# Patient Record
Sex: Male | Born: 1955 | Race: White | Hispanic: No | Marital: Married | Smoking: Never smoker
Health system: Southern US, Community
[De-identification: ages and names within clinical notes are randomized; demographics above are authoritative.]

## PROBLEM LIST (undated history)

## (undated) DIAGNOSIS — I10 Essential (primary) hypertension: Secondary | ICD-10-CM

## (undated) DIAGNOSIS — E785 Hyperlipidemia, unspecified: Secondary | ICD-10-CM

## (undated) DIAGNOSIS — I4892 Unspecified atrial flutter: Secondary | ICD-10-CM

## (undated) DIAGNOSIS — G4730 Sleep apnea, unspecified: Secondary | ICD-10-CM

## (undated) HISTORY — PX: CHOLECYSTECTOMY: SHX55

## (undated) HISTORY — PX: CARDIAC ELECTROPHYSIOLOGY MAPPING AND ABLATION: SHX1292

## (undated) SURGERY — ELECTROPHYSIOLOGY STUDY
Anesthesia: IV Sedation (MBSC Only)

---

## 2005-05-25 ENCOUNTER — Emergency Department: Payer: Self-pay | Admitting: Unknown Physician Specialty

## 2007-05-17 ENCOUNTER — Ambulatory Visit: Payer: Self-pay | Admitting: Gastroenterology

## 2011-11-23 ENCOUNTER — Ambulatory Visit: Payer: Self-pay

## 2021-08-26 ENCOUNTER — Other Ambulatory Visit: Payer: Self-pay | Admitting: Neurology

## 2021-08-26 DIAGNOSIS — R413 Other amnesia: Secondary | ICD-10-CM

## 2021-09-01 ENCOUNTER — Other Ambulatory Visit: Payer: Self-pay

## 2021-09-01 ENCOUNTER — Ambulatory Visit
Admission: RE | Admit: 2021-09-01 | Discharge: 2021-09-01 | Disposition: A | Discharge status: Home / Self Care | Payer: Medicare Other | Source: Ambulatory Visit | Attending: Neurology | Admitting: Neurology

## 2021-09-01 DIAGNOSIS — R413 Other amnesia: Secondary | ICD-10-CM | POA: Diagnosis present

## 2021-11-25 ENCOUNTER — Encounter: Payer: Self-pay | Admitting: Emergency Medicine

## 2021-11-25 ENCOUNTER — Emergency Department
Admission: EM | Admit: 2021-11-25 | Discharge: 2021-11-25 | Disposition: A | Discharge status: Home / Self Care | Payer: Medicare Other | Attending: Emergency Medicine | Admitting: Emergency Medicine

## 2021-11-25 ENCOUNTER — Other Ambulatory Visit: Payer: Self-pay

## 2021-11-25 ENCOUNTER — Emergency Department: Payer: Medicare Other

## 2021-11-25 DIAGNOSIS — I483 Typical atrial flutter: Secondary | ICD-10-CM | POA: Diagnosis present

## 2021-11-25 DIAGNOSIS — Z79899 Other long term (current) drug therapy: Secondary | ICD-10-CM | POA: Insufficient documentation

## 2021-11-25 DIAGNOSIS — Z7901 Long term (current) use of anticoagulants: Secondary | ICD-10-CM | POA: Diagnosis not present

## 2021-11-25 DIAGNOSIS — I10 Essential (primary) hypertension: Secondary | ICD-10-CM | POA: Diagnosis not present

## 2021-11-25 HISTORY — DX: Essential (primary) hypertension: I10

## 2021-11-25 HISTORY — DX: Hyperlipidemia, unspecified: E78.5

## 2021-11-25 HISTORY — DX: Unspecified atrial flutter: I48.92

## 2021-11-25 LAB — BASIC METABOLIC PANEL
Anion gap: 8 (ref 5–15)
BUN: 19 mg/dL (ref 8–23)
CO2: 26 mmol/L (ref 22–32)
Calcium: 9.3 mg/dL (ref 8.9–10.3)
Chloride: 101 mmol/L (ref 98–111)
Creatinine, Ser: 1.03 mg/dL (ref 0.61–1.24)
GFR, Estimated: >60 mL/min (ref >60)
Glucose, Bld: 111 mg/dL — ABNORMAL HIGH (ref 70–99)
Potassium: 4.1 mmol/L (ref 3.5–5.1)
Sodium: 135 mmol/L (ref 135–145)

## 2021-11-25 LAB — TROPONIN I (HIGH SENSITIVITY): Troponin I (High Sensitivity): 7 ng/L (ref <18)

## 2021-11-25 LAB — CBC
HCT: 47.5 % (ref 39.0–52.0)
Hemoglobin: 17.5 g/dL — ABNORMAL HIGH (ref 13.0–17.0)
MCH: 32.7 pg (ref 26.0–34.0)
MCHC: 36.8 g/dL — ABNORMAL HIGH (ref 30.0–36.0)
MCV: 88.8 fL (ref 80.0–100.0)
Platelets: 293 10*3/uL (ref 150–400)
RBC: 5.35 MIL/uL (ref 4.22–5.81)
RDW: 12.2 % (ref 11.5–15.5)
WBC: 7.4 10*3/uL (ref 4.0–10.5)
nRBC: 0 % (ref 0.0–0.2)

## 2021-11-25 MED ORDER — DABIGATRAN ETEXILATE MESYLATE 150 MG PO CAPS: 150.0000 mg | ORAL_CAPSULE | Freq: Two times a day (BID) | ORAL | 0 refills | Status: AC

## 2021-11-25 MED ORDER — METOPROLOL TARTRATE 25 MG PO TABS: 25.0000 mg | ORAL_TABLET | Freq: Two times a day (BID) | ORAL | 11 refills | Status: AC

## 2021-11-25 NOTE — ED Triage Notes (Signed)
Pt comes into the ED via Neurology where they realized the patient has a heart rate of 150.  Pt denies any chest pain, SHOB, or dizziness.  Pt ambulatory to triage at this time.  Pt has a h/o ablations at [facility] and he is currently supposed to be on blood thinners (Eliquis), but he stopped taking it because it made him feel poorly.  Pt's cardiologist is aware and informed him that he needed to at least be on a baby ASA.  Pt is currently taking the ASA.

## 2021-11-25 NOTE — ED Notes (Signed)
Bradler, MD at bedside. 

## 2021-11-25 NOTE — ED Provider Notes (Signed)
[facility] Emergency Department Provider Note   ____________________________________________   Event Date/Time   First MD Initiated Contact with Patient 11/25/21 1116     (approximate)  I have reviewed the triage vital signs and the nursing notes.   HISTORY  Chief Complaint Tachycardia    HPI William Mcintosh is a 65 y.o. male who presents from his neurologist's office after being found to incidentally be in atrial flutter.  Patient states that he feels that he may have gone back into a flutter starting 3 days prior to arrival  LOCATION: Chest DURATION: 3 days prior to arrival TIMING: Stable since onset SEVERITY: Mild QUALITY: Palpitations CONTEXT: Patient states that approximately 3 days prior to arrival he noticed some mild palpitations and increasing dyspnea on exertion but this atrial flutter was found incidentally during a neurology visit today MODIFYING FACTORS: Worsened with exertion and partially relieved at rest ASSOCIATED SYMPTOMS: Denies   Per medical record review, patient has history of atrial flutter status post ablation          Past Medical History:  Diagnosis Date   Atrial flutter (HCC)    Hyperlipidemia    Hypertension     There are no problems to display for this patient.   Past Surgical History:  Procedure Laterality Date   CARDIAC ELECTROPHYSIOLOGY MAPPING AND ABLATION     CHOLECYSTECTOMY      Prior to Admission medications   Medication Sig Start Date End Date Taking? Authorizing Provider  dabigatran (PRADAXA) 150 MG CAPS capsule Take 1 capsule (150 mg total) by mouth 2 (two) times daily. 11/25/21 12/25/21 Yes Merwyn Katos, MD  metoprolol tartrate (LOPRESSOR) 25 MG tablet Take 1 tablet (25 mg total) by mouth 2 (two) times daily. 11/25/21 11/25/22 Yes Merwyn Katos, MD    Allergies Patient has no known allergies.  History reviewed. No pertinent family history.  Social History Social History   Tobacco Use    Smoking status: Never   Smokeless tobacco: Never  Substance Use Topics   Alcohol use: Yes    Comment: occasional   Drug use: Not Currently    Review of Systems Constitutional: No fever/chills Eyes: No visual changes. ENT: No sore throat. Cardiovascular: Denies chest pain.  Endorses palpitations and dyspnea on exertion Respiratory: Denies shortness of breath. Gastrointestinal: No abdominal pain.  No nausea, no vomiting.  No diarrhea. Genitourinary: Negative for dysuria. Musculoskeletal: Negative for acute arthralgias Skin: Negative for rash. Neurological: Negative for headaches, weakness/numbness/paresthesias in any extremity Psychiatric: Negative for suicidal ideation/homicidal ideation   ____________________________________________   PHYSICAL EXAM:  VITAL SIGNS: ED Triage Vitals  Enc Vitals Group     BP 11/25/21 1056 (!) 120/93     Pulse Rate 11/25/21 1056 (!) 146     Resp 11/25/21 1056 19     Temp 11/25/21 1056 98.1 F (36.7 C)     Temp Source 11/25/21 1056 Oral     SpO2 11/25/21 1056 96 %     Weight 11/25/21 1054 211 lb (95.7 kg)     Height 11/25/21 1054 5\' 9"  (1.753 m)     Head Circumference --      Peak Flow --      Pain Score 11/25/21 1054 0     Pain Loc --      Pain Edu? --      Excl. in GC? --    Constitutional: Alert and oriented. Well appearing and in no acute distress. Eyes: Conjunctivae are normal. PERRL.  Head: Atraumatic. Nose: No congestion/rhinnorhea. Mouth/Throat: Mucous membranes are moist. Neck: No stridor Cardiovascular: Grossly normal heart sounds.  Good peripheral circulation. Respiratory: Normal respiratory effort.  No retractions. Gastrointestinal: Soft and nontender. No distention. Musculoskeletal: No obvious deformities Neurologic:  Normal speech and language. No gross focal neurologic deficits are appreciated. Skin:  Skin is warm and dry. No rash noted. Psychiatric: Mood and affect are normal. Speech and behavior are  normal.  ____________________________________________   LABS (all labs ordered are listed, but only abnormal results are displayed)  Labs Reviewed  BASIC METABOLIC PANEL - Abnormal; Notable for the following components:      Result Value   Glucose, Bld 111 (*)    All other components within normal limits  CBC - Abnormal; Notable for the following components:   Hemoglobin 17.5 (*)    MCHC 36.8 (*)    All other components within normal limits  TROPONIN I (HIGH SENSITIVITY)  TROPONIN I (HIGH SENSITIVITY)   ____________________________________________  EKG  ED ECG REPORT I, Merwyn Katos, the attending physician, personally viewed and interpreted this ECG.  Date: 11/25/2021 EKG Time: 1054 Rate: 145 Rhythm: Atrial flutter with 2-1 conduction QRS Axis: normal Intervals: normal ST/T Wave abnormalities: normal Narrative Interpretation: Atrial flutter with 2-1 conduction.  No evidence of acute ischemia  ____________________________________________  RADIOLOGY  ED MD interpretation: 2 view chest x-ray shows no evidence of acute abnormalities including no pneumonia, pneumothorax, or widened mediastinum  Official radiology report(s): DG Chest 2 View  Result Date: 11/25/2021 CLINICAL DATA:  Tachycardia. EXAM: CHEST - 2 VIEW COMPARISON:  None. FINDINGS: The heart size and mediastinal contours are within normal limits. Both lungs are clear. The visualized skeletal structures are unremarkable. IMPRESSION: No active cardiopulmonary disease. Electronically Signed   By: Obie Dredge M.D.   On: 11/25/2021 11:37    ____________________________________________   PROCEDURES  Procedure(s) performed (including Critical Care):  Procedures   ____________________________________________   INITIAL IMPRESSION / ASSESSMENT AND PLAN / ED COURSE  As part of my medical decision making, I reviewed the following data within the electronic medical record, if available:  Nursing notes  reviewed and incorporated, Labs reviewed, EKG interpreted, Old chart reviewed, Radiograph reviewed and Notes from prior ED visits reviewed and incorporated        + atrial flutter w/ RVR DDx: Pneumothorax, Pneumonia, Pulmonary Embolus, Tamponade, ACS, Thyrotoxicosis.  No history or evidence decompensated heart failure. Given their history and exam it is likely this patient is unlikely to spontaneously revert to a rate controlled rhythm and necessitates a thorough workup for their arrhythmia. Workup: ECG, CXR, CBC, BMP, UA, Troponin, BNP, TSH, Ca-Mag-Phos Consult: I spoke to Dr. Mariah Milling in cardiology who recommended starting patient on 25 mg metoprolol tartrate with instructions to hold if heart rate is under 100 and increase to 50 twice daily if heart rate stays above 100.  Patient also started back on anticoagulation Disposition: Discharge home with prompt PCP follow up and cardiology referral.         ____________________________________________   FINAL CLINICAL IMPRESSION(S) / ED DIAGNOSES  Final diagnoses:  Typical atrial flutter [facility])     ED Discharge Orders          Ordered    metoprolol tartrate (LOPRESSOR) 25 MG tablet  2 times daily        11/25/21 1235    dabigatran (PRADAXA) 150 MG CAPS capsule  2 times daily        11/25/21 1235  Note:  This document was prepared using Dragon voice recognition software and may include unintentional dictation errors.    Merwyn Katos, MD 11/25/21 [phone]

## 2021-11-29 ENCOUNTER — Encounter: Payer: Self-pay | Admitting: Anesthesiology

## 2021-11-30 ENCOUNTER — Ambulatory Visit: Admission: RE | Admit: 2021-11-30 | Payer: Medicare Other | Source: Ambulatory Visit | Admitting: Cardiology

## 2021-11-30 ENCOUNTER — Encounter: Admission: RE | Payer: Self-pay | Source: Ambulatory Visit

## 2021-11-30 SURGERY — ECHOCARDIOGRAM, TRANSESOPHAGEAL
Anesthesia: General

## 2021-12-11 ENCOUNTER — Encounter: Payer: Self-pay | Admitting: Emergency Medicine

## 2021-12-11 ENCOUNTER — Observation Stay
Admission: EM | Admit: 2021-12-11 | Discharge: 2021-12-12 | Disposition: A | Discharge status: Home / Self Care | Payer: Medicare Other | Attending: Internal Medicine | Admitting: Internal Medicine

## 2021-12-11 ENCOUNTER — Emergency Department: Payer: Medicare Other

## 2021-12-11 ENCOUNTER — Other Ambulatory Visit: Payer: Self-pay

## 2021-12-11 DIAGNOSIS — Z79899 Other long term (current) drug therapy: Secondary | ICD-10-CM | POA: Diagnosis not present

## 2021-12-11 DIAGNOSIS — I482 Chronic atrial fibrillation, unspecified: Secondary | ICD-10-CM

## 2021-12-11 DIAGNOSIS — Z85828 Personal history of other malignant neoplasm of skin: Secondary | ICD-10-CM | POA: Diagnosis not present

## 2021-12-11 DIAGNOSIS — I4892 Unspecified atrial flutter: Secondary | ICD-10-CM | POA: Diagnosis not present

## 2021-12-11 DIAGNOSIS — I4891 Unspecified atrial fibrillation: Secondary | ICD-10-CM | POA: Diagnosis present

## 2021-12-11 DIAGNOSIS — R002 Palpitations: Secondary | ICD-10-CM | POA: Diagnosis present

## 2021-12-11 DIAGNOSIS — Z20822 Contact with and (suspected) exposure to covid-19: Secondary | ICD-10-CM | POA: Insufficient documentation

## 2021-12-11 DIAGNOSIS — I10 Essential (primary) hypertension: Secondary | ICD-10-CM | POA: Diagnosis not present

## 2021-12-11 LAB — BASIC METABOLIC PANEL
Anion gap: 4 — ABNORMAL LOW (ref 5–15)
BUN: 23 mg/dL (ref 8–23)
CO2: 27 mmol/L (ref 22–32)
Calcium: 9.2 mg/dL (ref 8.9–10.3)
Chloride: 104 mmol/L (ref 98–111)
Creatinine, Ser: 1.21 mg/dL (ref 0.61–1.24)
GFR, Estimated: >60 mL/min (ref >60)
Glucose, Bld: 142 mg/dL — ABNORMAL HIGH (ref 70–99)
Potassium: 4.1 mmol/L (ref 3.5–5.1)
Sodium: 135 mmol/L (ref 135–145)

## 2021-12-11 LAB — RESP PANEL BY RT-PCR (FLU A&B, COVID) ARPGX2
Influenza A by PCR: NEGATIVE
Influenza B by PCR: NEGATIVE
SARS Coronavirus 2 by RT PCR: NEGATIVE

## 2021-12-11 LAB — TSH: TSH: 1.913 u[IU]/mL (ref 0.350–4.500)

## 2021-12-11 LAB — CBC
HCT: 47.6 % (ref 39.0–52.0)
Hemoglobin: 16.9 g/dL (ref 13.0–17.0)
MCH: 31.5 pg (ref 26.0–34.0)
MCHC: 35.5 g/dL (ref 30.0–36.0)
MCV: 88.8 fL (ref 80.0–100.0)
Platelets: 357 10*3/uL (ref 150–400)
RBC: 5.36 MIL/uL (ref 4.22–5.81)
RDW: 12 % (ref 11.5–15.5)
WBC: 6.9 10*3/uL (ref 4.0–10.5)
nRBC: 0 % (ref 0.0–0.2)

## 2021-12-11 LAB — TROPONIN I (HIGH SENSITIVITY): Troponin I (High Sensitivity): 5 ng/L (ref <18)

## 2021-12-11 MED ORDER — DIGOXIN 0.25 MG/ML IJ SOLN
0.3750 mg | Freq: Once | INTRAMUSCULAR | Status: AC
  Administered 2021-12-12: 0.375 mg via INTRAVENOUS
  Filled 2021-12-11: qty 2

## 2021-12-11 MED ORDER — ACETAMINOPHEN 325 MG PO TABS: 650.0000 mg | ORAL_TABLET | Freq: Four times a day (QID) | ORAL | Status: DC | PRN

## 2021-12-11 MED ORDER — ONDANSETRON HCL 4 MG PO TABS: 4.0000 mg | ORAL_TABLET | Freq: Four times a day (QID) | ORAL | Status: DC | PRN

## 2021-12-11 MED ORDER — DIGOXIN 0.25 MG/ML IJ SOLN
0.1250 mg | Freq: Once | INTRAMUSCULAR | Status: AC
  Administered 2021-12-11: 23:00:00 0.125 mg via INTRAVENOUS
  Filled 2021-12-11: qty 2

## 2021-12-11 MED ORDER — AMIODARONE LOAD VIA INFUSION
150.0000 mg | Freq: Once | INTRAVENOUS | Status: AC
  Administered 2021-12-11: 15:00:00 150 mg via INTRAVENOUS
  Filled 2021-12-11 (×2): qty 83.34

## 2021-12-11 MED ORDER — AMIODARONE HCL IN DEXTROSE 360-4.14 MG/200ML-% IV SOLN
30.0000 mg/h | INTRAVENOUS | Status: DC
  Administered 2021-12-11: 23:00:00 30 mg/h via INTRAVENOUS
  Filled 2021-12-11: qty 200

## 2021-12-11 MED ORDER — ATORVASTATIN CALCIUM 20 MG PO TABS
20.0000 mg | ORAL_TABLET | Freq: Every day | ORAL | Status: DC
  Administered 2021-12-12: 09:00:00 20 mg via ORAL
  Filled 2021-12-11: qty 1

## 2021-12-11 MED ORDER — DABIGATRAN ETEXILATE MESYLATE 150 MG PO CAPS: 150.0000 mg | ORAL_CAPSULE | Freq: Two times a day (BID) | ORAL | Status: DC

## 2021-12-11 MED ORDER — VITAMIN B-12 1000 MCG PO TABS
1000.0000 ug | ORAL_TABLET | ORAL | Status: DC
  Filled 2021-12-11: qty 1

## 2021-12-11 MED ORDER — AMIODARONE HCL IN DEXTROSE 360-4.14 MG/200ML-% IV SOLN
60.0000 mg/h | INTRAVENOUS | Status: AC
  Administered 2021-12-11 (×2): 60 mg/h via INTRAVENOUS
  Filled 2021-12-11 (×2): qty 200

## 2021-12-11 MED ORDER — ACETAMINOPHEN 325 MG RE SUPP: 650.0000 mg | Freq: Four times a day (QID) | RECTAL | Status: DC | PRN

## 2021-12-11 MED ORDER — APIXABAN 5 MG PO TABS
5.0000 mg | ORAL_TABLET | Freq: Two times a day (BID) | ORAL | Status: DC
  Administered 2021-12-12 (×2): 5 mg via ORAL
  Filled 2021-12-11 (×2): qty 1

## 2021-12-11 MED ORDER — ONDANSETRON HCL 4 MG/2ML IJ SOLN: 4.0000 mg | Freq: Four times a day (QID) | INTRAMUSCULAR | Status: DC | PRN

## 2021-12-11 NOTE — ED Notes (Signed)
Pt & wife are asking if maybe since the amiodarone is not bringing pts HR down, could this be switched to diltiazem & maybe he could go home tonight.  Secure msg sent to Dr. Juliann Pares re: pts request to maybe change to diltiazem PO.

## 2021-12-11 NOTE — ED Notes (Signed)
Verbal order from Dr. Juliann Pares for 0.375mg  digoxin IVP for a total amount of 0.5mg  IVP.

## 2021-12-11 NOTE — ED Triage Notes (Signed)
Pt via POV from home. Pt c/o palpitations the past 2 days, denies any pain. Pt has a hx of a flutter that he was cardioverted. Pt also endorses fatigue. Pt is A&Ox4 and NAD.

## 2021-12-11 NOTE — H&P (Signed)
History and Physical    William Mcintosh YQI:[id number] DOB: March 27, 1956 DOA: 12/11/2021  PCP: [facility]  Patient coming from:palpitations  I have personally briefly reviewed patient's old medical records in [facility] Link  Chief Complaint: palipitations  HPI: William Mcintosh is a 65 y.o. male with medical history significant of HLD,HTN, ?OSA pending evaluation, depression, hx of basal cell ca, Afib/flutter with most recent er visit on 12/7 were he was found to have asymptomatic afib/flutter to 140's after he was referred from outpatient clinic due to elevated heart rate. Patient thereafter was seen by cardiology who restarted patient on full dose anticoagulation with Eliquis. They also slated patient for elective cardioversion due to patient sensitivity to AV nodal blocking agents. Patient was noted however prior to cardioversion to have converted to sinus rhythm.Patient now returns with four days of palpitations and on evaluation in ed found to have afib with rvr. Of note patient states his afib/flutter is usually asymptomatic. He states he may have increase fatigue from baseline with more strenuous activity but does not have sob,chest pain , dizziness or feelings of persistent palpitations. He states he noted his hr was elevated when he checked his blood pressure. He also does mention interim history of URI prior to this onset that he took antibiotics for. He states his uri symptoms have mostly resolved. Cardiology was consulted who recommended started patient on amiodarone drip. Patient thereafter slated for admission to hospitalist service   ED Course:  Ekg : afib/flutter 140's  Afeb, bp 135/104, hr 146, rr 20  sat 97%  Labs: NA 135, K 4.1, CL 104  gluc 142,cr1.2 Ce 5 Cxr:NAD Respiratory panel negative  Wbc:6.9, hgb 16.9 Review of Systems: As per HPI otherwise 10 point review of systems negative.   Past Medical History:  Diagnosis Date   Atrial flutter (HCC)     Hyperlipidemia    Hypertension     Past Surgical History:  Procedure Laterality Date   CARDIAC ELECTROPHYSIOLOGY MAPPING AND ABLATION     CHOLECYSTECTOMY       reports that he has never smoked. He has never used smokeless tobacco. He reports current alcohol use. He reports that he does not currently use drugs.  No Known Allergies  History reviewed. No pertinent family history.  Prior to Admission medications   Medication Sig Start Date End Date Taking? Authorizing Provider  atorvastatin (LIPITOR) 20 MG tablet Take 20 mg by mouth daily. 08/29/21   [provider]  Cyanocobalamin (B-12) 1000 MCG SUBL Place 1,000 mcg under the tongue 3 (three) times a week.    [provider]  dabigatran (PRADAXA) 150 MG CAPS capsule Take 1 capsule (150 mg total) by mouth 2 (two) times daily. 11/25/21 12/25/21  Merwyn Katos, MD  enalapril-hydrochlorothiazide (VASERETIC) 10-25 MG tablet Take 1 tablet by mouth daily. 08/29/21   [provider]  metoprolol tartrate (LOPRESSOR) 25 MG tablet Take 1 tablet (25 mg total) by mouth 2 (two) times daily. Patient not taking: Reported on 11/26/2021 11/25/21 11/25/22  Merwyn Katos, MD    Physical Exam: Vitals:   12/11/21 1128 12/11/21 1135 12/11/21 1315  BP: (!) 135/104    Pulse: (!) 146    Resp: 20  19  Temp: 97.9 F (36.6 C)    TempSrc: Oral    SpO2: 97%    Weight:  96.2 kg   Height:  5\' 9"  (1.753 m)     Constitutional: NAD, calm, comfortable Vitals:   12/11/21  1128 12/11/21 1135 12/11/21 1315  BP: (!) 135/104    Pulse: (!) 146    Resp: 20  19  Temp: 97.9 F (36.6 C)    TempSrc: Oral    SpO2: 97%    Weight:  96.2 kg   Height:  5\' 9"  (1.753 m)    Eyes: PERRL, lids and conjunctivae normal ENMT: Mucous membranes are moist. Posterior pharynx clear of any exudate or lesions.Normal dentition.  Neck: normal, supple, no masses, no thyromegaly Respiratory: clear to auscultation bilaterally, no wheezing, no crackles. Normal  respiratory effort. No accessory muscle use.  Cardiovascular: Regular rate and rhythm, no murmurs / rubs / gallops. No extremity edema. 2+ pedal pulses. No carotid bruits.  Abdomen: no tenderness, no masses palpated. No hepatosplenomegaly. Bowel sounds positive.  Musculoskeletal: no clubbing / cyanosis. No joint deformity upper and lower extremities. Good ROM, no contractures. Normal muscle tone.  Skin: no rashes, lesions, ulcers. No induration Neurologic: CN 2-12 grossly intact. Sensation intact, DTR normal. Strength 5/5 in all 4.  Psychiatric: Normal judgment and insight. Alert and oriented x 3. Normal mood.    Labs on Admission: I have personally reviewed following labs and imaging studies  CBC: Recent Labs  Lab 12/11/21 1133  WBC 6.9  HGB 16.9  HCT 47.6  MCV 88.8  PLT 357   Basic Metabolic Panel: Recent Labs  Lab 12/11/21 1133  NA 135  K 4.1  CL 104  CO2 27  GLUCOSE 142*  BUN 23  CREATININE 1.21  CALCIUM 9.2   GFR: Estimated Creatinine Clearance: 68.7 mL/min (by C-G formula based on SCr of 1.21 mg/dL). Liver Function Tests: No results for input(s): AST, ALT, ALKPHOS, BILITOT, PROT, ALBUMIN in the last 168 hours. No results for input(s): LIPASE, AMYLASE in the last 168 hours. No results for input(s): AMMONIA in the last 168 hours. Coagulation Profile: No results for input(s): INR, PROTIME in the last 168 hours. Cardiac Enzymes: No results for input(s): CKTOTAL, CKMB, CKMBINDEX, TROPONINI in the last 168 hours. BNP (last 3 results) No results for input(s): PROBNP in the last 8760 hours. HbA1C: No results for input(s): HGBA1C in the last 72 hours. CBG: No results for input(s): GLUCAP in the last 168 hours. Lipid Profile: No results for input(s): CHOL, HDL, LDLCALC, TRIG, CHOLHDL, LDLDIRECT in the last 72 hours. Thyroid Function Tests: No results for input(s): TSH, T4TOTAL, FREET4, T3FREE, THYROIDAB in the last 72 hours. Anemia Panel: No results for input(s):  VITAMINB12, FOLATE, FERRITIN, TIBC, IRON, RETICCTPCT in the last 72 hours. Urine analysis: No results found for: COLORURINE, APPEARANCEUR, LABSPEC, PHURINE, GLUCOSEU, HGBUR, BILIRUBINUR, KETONESUR, PROTEINUR, UROBILINOGEN, NITRITE, LEUKOCYTESUR  Radiological Exams on Admission: DG Chest 2 View  Result Date: 12/11/2021 CLINICAL DATA:  Chest pain EXAM: CHEST - 2 VIEW COMPARISON:  11/25/2021 FINDINGS: The heart size and mediastinal contours are within normal limits. Both lungs are clear. The visualized skeletal structures are unremarkable. Surgical clips are seen in the right upper quadrant. IMPRESSION: No active cardiopulmonary disease. Electronically Signed   By: 14/07/2021 M.D.   On: 12/11/2021 12:26    EKG: Independently reviewed.as noted above  Assessment/Plan Afib/flutter with rvr -admit to progressive care  -continue on amiodarone drip per cardiology rec  - await further cardiology recs -place on electrolyte protocol  -continue pradaxa  HLD -continue statin   HTN  -noted uncontrolled initially but w/o treatment now patient is normotensive to borderline low -resume home regimen in am as bp tolerates -prn medications as needed  ?OSA -  pending evaluation, -monitor pulse ox ,[place] [facility]   Depression -no active issues     DVT prophylaxis: praxada Code Status: full Family Communication: n/a Disposition Plan:  patient  expected to be admitted lessthan 2 midnights Consults called: Dr Juliann Pares Admission status: progressive care   Lurline Del MD [facility]   If 7PM-7AM, please contact night-coverage [email or web address] Password [email or web address]  12/11/2021, 2:21 PM

## 2021-12-11 NOTE — Consult Note (Signed)
ANTICOAGULATION CONSULT NOTE - Initial Consult  Pharmacy Consult for apixaban dosing Indication: atrial fibrillation  No Known Allergies  Patient Measurements: Height: 5\' 9"  (175.3 cm) Weight: 96.2 kg (212 lb) IBW/kg (Calculated) : 70.7   Vital Signs: Temp: 97.9 F (36.6 C) (12/24 1128) Temp Source: Oral (12/24 1128) BP: 113/82 (12/24 1600) Pulse Rate: 146 (12/24 1128)  Labs: Recent Labs    12/11/21 1133  HGB 16.9  HCT 47.6  PLT 357  CREATININE 1.21  TROPONINIHS 5    Estimated Creatinine Clearance: 68.7 mL/min (by C-G formula based on SCr of 1.21 mg/dL).   Medical History: Past Medical History:  Diagnosis Date   Atrial flutter (HCC)    Hyperlipidemia    Hypertension     Medications:  Pt was on pradaxa 150mg  but had not been taking as it was not covered by insurance.  Assessment:  Patient with history of atrial flutter/fibrillation, had radiofrequency ablation over the summer, had been doing well until found to be in atrial flutter with a heart rate of 140s at outpatient doctor visit.  Goal of Therapy:   Monitor platelets by anticoagulation protocol: Yes   Plan:  Will begin apixaban therapy at 5mg  BID for afib.  No indications for reduced dose.  12/11/2021,5:31 PM

## 2021-12-11 NOTE — Progress Notes (Signed)
Cross Cover No improvement in rate on amio.  BP borderline low Digoxin 0.125 x1

## 2021-12-11 NOTE — ED Notes (Signed)
COVID swab sent to lab.

## 2021-12-11 NOTE — ED Provider Notes (Signed)
[facility] Emergency Department Provider Note   ____________________________________________    I have reviewed the triage vital signs and the nursing notes.   HISTORY  Chief Complaint Palpitations     HPI William Mcintosh is a 65 y.o. male who presents with complaints of palpitations.  Patient with history of atrial flutter/fibrillation, had radiofrequency ablation over the summer, had been doing well until found to be in atrial flutter with a heart rate of 140s at outpatient doctor visit.  Seen by cardiology, TEE cardioversion scheduled but then canceled after the patient converted to normal sinus rhythm.  Started on Eliquis.  Patient presents with 3 days of "feeling fatigued ".  Noted high heart rate yesterday  Past Medical History:  Diagnosis Date   Atrial flutter (HCC)    Hyperlipidemia    Hypertension     There are no problems to display for this patient.   Past Surgical History:  Procedure Laterality Date   CARDIAC ELECTROPHYSIOLOGY MAPPING AND ABLATION     CHOLECYSTECTOMY      Prior to Admission medications   Medication Sig Start Date End Date Taking? Authorizing Provider  atorvastatin (LIPITOR) 20 MG tablet Take 20 mg by mouth daily. 08/29/21   [provider]  Cyanocobalamin (B-12) 1000 MCG SUBL Place 1,000 mcg under the tongue 3 (three) times a week.    [provider]  dabigatran (PRADAXA) 150 MG CAPS capsule Take 1 capsule (150 mg total) by mouth 2 (two) times daily. 11/25/21 12/25/21  Merwyn Katos, MD  enalapril-hydrochlorothiazide (VASERETIC) 10-25 MG tablet Take 1 tablet by mouth daily. 08/29/21   [provider]  metoprolol tartrate (LOPRESSOR) 25 MG tablet Take 1 tablet (25 mg total) by mouth 2 (two) times daily. Patient not taking: Reported on 11/26/2021 11/25/21 11/25/22  Merwyn Katos, MD     Allergies Patient has no known allergies.  History reviewed. No pertinent family history.  Social  History Social History   Tobacco Use   Smoking status: Never   Smokeless tobacco: Never  Substance Use Topics   Alcohol use: Yes    Comment: occasional   Drug use: Not Currently    Review of Systems  Constitutional: No fever/chills Eyes: No visual changes.  ENT: No sore throat. Cardiovascular: As above Respiratory: Denies shortness of breath. Gastrointestinal: No abdominal pain.  No nausea, no vomiting.   Genitourinary: Negative for dysuria. Musculoskeletal: Negative for back pain. Skin: Negative for rash. Neurological: Negative for headaches   ____________________________________________   PHYSICAL EXAM:  VITAL SIGNS: ED Triage Vitals  Enc Vitals Group     BP 12/11/21 1128 (!) 135/104     Pulse Rate 12/11/21 1128 (!) 146     Resp 12/11/21 1128 20     Temp 12/11/21 1128 97.9 F (36.6 C)     Temp Source 12/11/21 1128 Oral     SpO2 12/11/21 1128 97 %     Weight 12/11/21 1135 96.2 kg (212 lb)     Height 12/11/21 1135 1.753 m (5\' 9" )     Head Circumference --      Peak Flow --      Pain Score 12/11/21 1128 0     Pain Loc --      Pain Edu? --      Excl. in GC? --     Constitutional: Alert and oriented. No acute distress. Pleasant and interactive Eyes: Conjunctivae are normal.  Head: Atraumatic. Nose: No congestion/rhinnorhea. Mouth/Throat: Mucous membranes are  moist.   Neck:  Painless ROM Cardiovascular: Tachycardia, regular rhythm, warm and well perfused Respiratory: Normal respiratory effort.  No retractions. Gastrointestinal: Soft and nontender. No distention.    Musculoskeletal: No lower extremity tenderness nor edema.  Warm and well perfused Neurologic:  Normal speech and language. No gross focal neurologic deficits are appreciated.  Skin:  Skin is warm, dry and intact. No rash noted. Psychiatric: Mood and affect are normal. Speech and behavior are normal.  ____________________________________________   LABS (all labs ordered are listed, but only  abnormal results are displayed)  Labs Reviewed  BASIC METABOLIC PANEL - Abnormal; Notable for the following components:      Result Value   Glucose, Bld 142 (*)    Anion gap 4 (*)    All other components within normal limits  RESP PANEL BY RT-PCR (FLU A&B, COVID) ARPGX2  CBC  TROPONIN I (HIGH SENSITIVITY)   ____________________________________________  EKG  ED ECG REPORT I, Jene Every, the attending physician, personally viewed and interpreted this ECG.  Date: 12/11/2021  Rate: 146 Rhythm: Atrial flutter QRS Axis: normal Intervals: Abnormal ST/T Wave abnormalities: normal Narrative Interpretation: Consistent with atrial flutter  ____________________________________________  RADIOLOGY Chest x-ray reviewed by me, no acute abnormality  ____________________________________________   PROCEDURES  Procedure(s) performed: No  Procedures   Critical Care performed:yes  CRITICAL CARE Performed by: Jene Every   Total critical care time: 30 minutes  Critical care time was exclusive of separately billable procedures and treating other patients.  Critical care was necessary to treat or prevent imminent or life-threatening deterioration.  Critical care was time spent personally by me on the following activities: development of treatment plan with patient and/or surrogate as well as nursing, discussions with consultants, evaluation of patient's response to treatment, examination of patient, obtaining history from patient or surrogate, ordering and performing treatments and interventions, ordering and review of laboratory studies, ordering and review of radiographic studies, pulse oximetry and re-evaluation of patient's condition.  ____________________________________________   INITIAL IMPRESSION / ASSESSMENT AND PLAN / ED COURSE  Pertinent labs & imaging results that were available during my care of the patient were reviewed by me and considered in my medical  decision making (see chart for details).   Patient presents in atrial flutter, has been this way for at least 24 hours, likely longer.  He is taking Eliquis.  Feels fatigued  Lab work today is overall reassuring, high sensitive troponin is normal, CBC, BMP is unremarkable  Discussed with Dr. Juliann Pares of cardiology who recommends starting the patient on amiodarone bolus and infusion and admitting to the hospital for rate control  Discussed with patient and he agrees to admission      ____________________________________________   FINAL CLINICAL IMPRESSION(S) / ED DIAGNOSES  Final diagnoses:  Atrial flutter with rapid ventricular response (HCC)        Note:  This document was prepared using Dragon voice recognition software and may include unintentional dictation errors.    Jene Every, MD 12/11/21 1343

## 2021-12-11 NOTE — Consult Note (Signed)
CARDIOLOGY CONSULT NOTE               Patient ID: LEN AZEEZ MRN: [id number] DOB/AGE: 1956/08/19 65 y.o.  Admit date: 12/11/2021 Referring Physician Dr. Clearence Ped hospitalist Primary Physician [facility] Primary Cardiologist Paraschos Reason for Consultation atrial flutter flutter RVR  HPI: Patient is a 65 year old male history of hypertension hyperlipidemia possible obstructive sleep apnea depression previous atrial fibrillation a flutter on anticoagulation with Pradaxa patient underwent cardioversion recently but over the last few days struggling to diabetes mellitus palpitation tachycardia heart rate above 130 he took extra metoprolol but did not seem to help he recently had an ablation in [facility] about 6 months ago and has subsequently discontinued anticoagulation recently over the last 2 to 3 days developed tachycardia rate in the 140s family presented for evaluation was found to be in atrial for flutter minimal palpitations with lightheadedness no bleeding issues on anticoagulants no significant chest pain  Review of systems complete and found to be negative unless listed above     Past Medical History:  Diagnosis Date   Atrial flutter (HCC)    Hyperlipidemia    Hypertension     Past Surgical History:  Procedure Laterality Date   CARDIAC ELECTROPHYSIOLOGY MAPPING AND ABLATION     CHOLECYSTECTOMY      (Not in a hospital admission)  Social History   Socioeconomic History   Marital status: Married    Spouse name: Not on file   Number of children: Not on file   Years of education: Not on file   Highest education level: Not on file  Occupational History   Not on file  Tobacco Use   Smoking status: Never   Smokeless tobacco: Never  Substance and Sexual Activity   Alcohol use: Yes    Comment: occasional   Drug use: Not Currently   Sexual activity: Not on file  Other Topics Concern   Not on file  Social History Narrative   Not on file    Social Determinants of Health   Financial Resource Strain: Not on file  Food Insecurity: Not on file  Transportation Needs: Not on file  Physical Activity: Not on file  Stress: Not on file  Social Connections: Not on file  Intimate Partner Violence: Not on file    History reviewed. No pertinent family history.    Review of systems complete and found to be negative unless listed above      PHYSICAL EXAM  General: Well developed, well nourished, in no acute distress HEENT:  Normocephalic and atramatic Neck:  No JVD.  Lungs: Clear bilaterally to auscultation and percussion. Heart: Tachycardia. Normal S1 and S2 without gallops or murmurs.  Abdomen: Bowel sounds are positive, abdomen soft and non-tender  Msk:  Back normal, normal gait. Normal strength and tone for age. Extremities: No clubbing, cyanosis or edema.   Neuro: Alert and oriented X 3. Psych:  Good affect, responds appropriately  Labs:   Lab Results  Component Value Date   WBC 6.9 12/11/2021   HGB 16.9 12/11/2021   HCT 47.6 12/11/2021   MCV 88.8 12/11/2021   PLT 357 12/11/2021    Recent Labs  Lab 12/11/21 1133  NA 135  K 4.1  CL 104  CO2 27  BUN 23  CREATININE 1.21  CALCIUM 9.2  GLUCOSE 142*   No results found for: CKTOTAL, CKMB, CKMBINDEX, TROPONINI No results found for: CHOL No results found for: HDL No results found for: LDLCALC No  results found for: TRIG No results found for: CHOLHDL No results found for: LDLDIRECT    Radiology: DG Chest 2 View  Result Date: 12/11/2021 CLINICAL DATA:  Chest pain EXAM: CHEST - 2 VIEW COMPARISON:  11/25/2021 FINDINGS: The heart size and mediastinal contours are within normal limits. Both lungs are clear. The visualized skeletal structures are unremarkable. Surgical clips are seen in the right upper quadrant. IMPRESSION: No active cardiopulmonary disease. Electronically Signed   By: Ernie Avena M.D.   On: 12/11/2021 12:26   DG Chest 2 View  Result  Date: 11/25/2021 CLINICAL DATA:  Tachycardia. EXAM: CHEST - 2 VIEW COMPARISON:  None. FINDINGS: The heart size and mediastinal contours are within normal limits. Both lungs are clear. The visualized skeletal structures are unremarkable. IMPRESSION: No active cardiopulmonary disease. Electronically Signed   By: Obie Dredge M.D.   On: 11/25/2021 11:37    EKG: Atrial flutter narrow complex rate of 140  ASSESSMENT AND PLAN:  Atrial flutter Tachycardia Hypertension Hyperlipidemia Possible obstructive sleep apnea Status post ablation . Plan Agree with admit to telemetry Resume anticoagulation with Pradaxa Rate control with metoprolol and Cardizem Recommend adding amiodarone IV loading drip for rhythm management Continue statin therapy for hyperlipidemia Recommend sleep study for evaluation of obstructive sleep apnea including modest weight loss Follow-up electrolytes Consider repeat cardioversion   Signed: Alwyn Pea MD 12/11/2021, 2:52 PM

## 2021-12-12 ENCOUNTER — Other Ambulatory Visit: Payer: Self-pay

## 2021-12-12 DIAGNOSIS — I482 Chronic atrial fibrillation, unspecified: Secondary | ICD-10-CM | POA: Diagnosis not present

## 2021-12-12 LAB — BASIC METABOLIC PANEL
Anion gap: 7 (ref 5–15)
BUN: 25 mg/dL — ABNORMAL HIGH (ref 8–23)
CO2: 28 mmol/L (ref 22–32)
Calcium: 9.2 mg/dL (ref 8.9–10.3)
Chloride: 102 mmol/L (ref 98–111)
Creatinine, Ser: 1.14 mg/dL (ref 0.61–1.24)
GFR, Estimated: >60 mL/min (ref >60)
Glucose, Bld: 123 mg/dL — ABNORMAL HIGH (ref 70–99)
Potassium: 4.2 mmol/L (ref 3.5–5.1)
Sodium: 137 mmol/L (ref 135–145)

## 2021-12-12 LAB — CBC
HCT: 42.9 % (ref 39.0–52.0)
Hemoglobin: 15.5 g/dL (ref 13.0–17.0)
MCH: 32.2 pg (ref 26.0–34.0)
MCHC: 36.1 g/dL — ABNORMAL HIGH (ref 30.0–36.0)
MCV: 89 fL (ref 80.0–100.0)
Platelets: 301 10*3/uL (ref 150–400)
RBC: 4.82 MIL/uL (ref 4.22–5.81)
RDW: 12.1 % (ref 11.5–15.5)
WBC: 7.9 10*3/uL (ref 4.0–10.5)
nRBC: 0 % (ref 0.0–0.2)

## 2021-12-12 LAB — HIV ANTIBODY (ROUTINE TESTING W REFLEX): HIV Screen 4th Generation wRfx: NONREACTIVE

## 2021-12-12 MED ORDER — DILTIAZEM LOAD VIA INFUSION
10.0000 mg | Freq: Once | INTRAVENOUS | Status: AC
  Administered 2021-12-12: 03:00:00 10 mg via INTRAVENOUS
  Filled 2021-12-12: qty 10

## 2021-12-12 MED ORDER — DIGOXIN 125 MCG PO TABS: 0.1250 mg | ORAL_TABLET | Freq: Every day | ORAL | Status: DC

## 2021-12-12 MED ORDER — MIDODRINE HCL 5 MG PO TABS
5.0000 mg | ORAL_TABLET | Freq: Three times a day (TID) | ORAL | Status: DC
  Administered 2021-12-12 (×2): 5 mg via ORAL
  Filled 2021-12-12 (×2): qty 1

## 2021-12-12 MED ORDER — DILTIAZEM HCL-DEXTROSE 125-5 MG/125ML-% IV SOLN (PREMIX)
5.0000 mg/h | INTRAVENOUS | Status: DC
  Administered 2021-12-12: 03:00:00 5 mg/h via INTRAVENOUS
  Filled 2021-12-12: qty 125

## 2021-12-12 MED ORDER — DILTIAZEM HCL 60 MG PO TABS
30.0000 mg | ORAL_TABLET | Freq: Four times a day (QID) | ORAL | Status: DC
  Administered 2021-12-12 (×2): 30 mg via ORAL
  Filled 2021-12-12 (×2): qty 1

## 2021-12-12 MED ORDER — DIGOXIN 0.25 MG/ML IJ SOLN
0.2500 mg | Freq: Once | INTRAMUSCULAR | Status: AC
  Administered 2021-12-12: 09:00:00 0.25 mg via INTRAVENOUS
  Filled 2021-12-12: qty 2

## 2021-12-12 MED ORDER — AMIODARONE HCL 200 MG PO TABS
400.0000 mg | ORAL_TABLET | Freq: Two times a day (BID) | ORAL | Status: DC
  Administered 2021-12-12: 09:00:00 400 mg via ORAL
  Filled 2021-12-12: qty 2

## 2021-12-12 MED ORDER — DIGOXIN 0.25 MG/ML IJ SOLN
0.2500 mg | INTRAMUSCULAR | Status: AC
  Administered 2021-12-12: 05:00:00 0.25 mg via INTRAVENOUS
  Filled 2021-12-12 (×2): qty 2

## 2021-12-12 NOTE — ED Notes (Signed)
Patient notified nurse that he wanted to leave. He reported that he already called his wife too. He said he was frustrated that nothing was working and his heart rate had not improved and that "it had been high for three days already." He also reported the monitor beeping was causing some agitation and patient was not able to rest. I let him know I would try to fix the monitor/decrease beeping and make him as comfortable as possible. Also informed pt the cardiologist had recently added on another medication to try to help with HR if he could try that for a few hours and see if his HR responds and then reevaluate in the am after speaking with provider and seeing response. Patient agreed to try Cardizem drip and became calmer and said he would stay.

## 2021-12-12 NOTE — ED Notes (Signed)
Dr Juliann Pares messaged via secure chat to confirm digoxin order per Dr Marylu Lund

## 2021-12-12 NOTE — Discharge Summary (Signed)
William Mcintosh PXT:[id number] DOB: 1956-07-31 DOA: 12/11/2021  PCP: [facility]  Admit date: 12/11/2021 Discharge date: 12/12/2021  Admitted From: home Disposition:  left AMA prior to cardiology seeing the pt.    Brief/Interim Summary:  Per OEV:[id number] R Ocain is a 65 y.o. male with medical history significant of HLD,HTN, ?OSA pending evaluation, depression, hx of basal cell ca, Afib/flutter with most recent er visit on 12/7 were he was found to have asymptomatic afib/flutter to 140's after he was referred from outpatient clinic due to elevated heart rate. Patient thereafter was seen by cardiology who restarted patient on full dose anticoagulation with Eliquis. They also slated patient for elective cardioversion due to patient sensitivity to AV nodal blocking agents. Patient was noted however prior to cardioversion to have converted to sinus rhythm.Patient now returns with four days of palpitations and on evaluation in ed found to have afib with rvr. Of note patient states his afib/flutter is usually asymptomatic. He states he may have increase fatigue from baseline with more strenuous activity but does not have sob,chest pain , dizziness or feelings of persistent palpitations. He states he noted his hr was elevated when he checked his blood pressure. He also does mention interim history of URI prior to this onset that he took antibiotics for. He states his uri symptoms have mostly resolved. Cardiology was consulted who recommended started patient on amiodarone drip. Patient thereafter slated for admission to hospitalist service   This am I saw pt, he was on cardizem and amiodarone gtt with HR in 130's. They insisted on leaving.  Explained to wife and the patient although his Chrismon S and I understand they would like to go home he is currently on 2 drips to control his heart rate and his heart rate is now well controlled.  We will like to initiate p.o. medication and wean him off of the  drips.  Also cardiology needs to see the patient and decide if can be discharged.  Nursing did message cardiology and myself as a group chat that patient wanted to see cardiology wanted to leave.  However patient left AMA prior to cardiology seeing him.  He said he was going to follow-up with [facility] cardiology on Tuesday.    Discharge Diagnoses:  Principal Problem:   A-fib [facility])    Discharge Instructions  Discharge Instructions     Amb referral to AFIB Clinic   Complete by: As directed         No Known Allergies  Consultations: Cardiology   Procedures/Studies: DG Chest 2 View  Result Date: 12/11/2021 CLINICAL DATA:  Chest pain EXAM: CHEST - 2 VIEW COMPARISON:  11/25/2021 FINDINGS: The heart size and mediastinal contours are within normal limits. Both lungs are clear. The visualized skeletal structures are unremarkable. Surgical clips are seen in the right upper quadrant. IMPRESSION: No active cardiopulmonary disease. Electronically Signed   By: Ernie Avena M.D.   On: 12/11/2021 12:26   DG Chest 2 View  Result Date: 11/25/2021 CLINICAL DATA:  Tachycardia. EXAM: CHEST - 2 VIEW COMPARISON:  None. FINDINGS: The heart size and mediastinal contours are within normal limits. Both lungs are clear. The visualized skeletal structures are unremarkable. IMPRESSION: No active cardiopulmonary disease. Electronically Signed   By: Obie Dredge M.D.   On: 11/25/2021 11:37      Subjective: Denied chest pain, dizziness or lightheadedness or shortness of breath  Discharge Exam: Vitals:   12/12/21 1030 12/12/21 1045  BP:  131/83  Pulse:  89  Resp: (!) 24 19  Temp:    SpO2: 100% 97%   Vitals:   12/12/21 0730 12/12/21 0915 12/12/21 1030 12/12/21 1045  BP: 115/71   131/83  Pulse:  (!) 142  89  Resp: 12 15 (!) 24 19  Temp:      TempSrc:      SpO2:  97% 100% 97%  Weight:      Height:        General: Pt is alert, awake, not in acute distress Cardiovascular:  Irregular, tachycardic Respiratory: CTA bilaterally, no wheezing, no rhonchi Abdominal: Soft, NT, ND, bowel sounds + Extremities: no edema    The results of significant diagnostics from this hospitalization (including imaging, microbiology, ancillary and laboratory) are listed below for reference.     Microbiology: Recent Results (from the past 240 hour(s))  Resp Panel by RT-PCR (Flu A&B, Covid) Nasopharyngeal Swab     Status: None   Collection Time: 12/11/21  2:48 PM   Specimen: Nasopharyngeal Swab; Nasopharyngeal(NP) swabs in vial transport medium  Result Value Ref Range Status   SARS Coronavirus 2 by RT PCR NEGATIVE NEGATIVE Final    Comment: (NOTE) SARS-CoV-2 target nucleic acids are NOT DETECTED.  The SARS-CoV-2 RNA is generally detectable in upper respiratory specimens during the acute phase of infection. The lowest concentration of SARS-CoV-2 viral copies this assay can detect is 138 copies/mL. A negative result does not preclude SARS-Cov-2 infection and should not be used as the sole basis for treatment or other patient management decisions. A negative result may occur with  improper specimen collection/handling, submission of specimen other than nasopharyngeal swab, presence of viral mutation(s) within the areas targeted by this assay, and inadequate number of viral copies(<138 copies/mL). A negative result must be combined with clinical observations, patient history, and epidemiological information. The expected result is Negative.  Fact Sheet for Patients:  BloggerCourse.com  Fact Sheet for Healthcare Providers:  SeriousBroker.it  This test is no t yet approved or cleared by the [place] FDA and  has been authorized for detection and/or diagnosis of SARS-CoV-2 by FDA under an Emergency Use Authorization (EUA). This EUA will remain  in effect (meaning this test can be used) for the duration of the COVID-19  declaration under Section 564(b)(1) of the Act, 21 U.S.C.section 360bbb-3(b)(1), unless the authorization is terminated  or revoked sooner.       Influenza A by PCR NEGATIVE NEGATIVE Final   Influenza B by PCR NEGATIVE NEGATIVE Final    Comment: (NOTE) The Xpert Xpress SARS-CoV-2/FLU/RSV plus assay is intended as an aid in the diagnosis of influenza from Nasopharyngeal swab specimens and should not be used as a sole basis for treatment. Nasal washings and aspirates are unacceptable for Xpert Xpress SARS-CoV-2/FLU/RSV testing.  Fact Sheet for Patients: BloggerCourse.com  Fact Sheet for Healthcare Providers: SeriousBroker.it  This test is not yet approved or cleared by the [place] FDA and has been authorized for detection and/or diagnosis of SARS-CoV-2 by FDA under an Emergency Use Authorization (EUA). This EUA will remain in effect (meaning this test can be used) for the duration of the COVID-19 declaration under Section 564(b)(1) of the Act, 21 U.S.C. section 360bbb-3(b)(1), unless the authorization is terminated or revoked.  Performed at [facility], [address] [place], [place] [address]      Labs: BNP (last 3 results) No results for input(s): BNP in the last 8760 hours. Basic Metabolic Panel: Recent Labs  Lab 12/11/21 1133 12/12/21 1914  NA 135 137  K 4.1 4.2  CL 104 102  CO2 27 28  GLUCOSE 142* 123*  BUN 23 25*  CREATININE 1.21 1.14  CALCIUM 9.2 9.2   Liver Function Tests: No results for input(s): AST, ALT, ALKPHOS, BILITOT, PROT, ALBUMIN in the last 168 hours. No results for input(s): LIPASE, AMYLASE in the last 168 hours. No results for input(s): AMMONIA in the last 168 hours. CBC: Recent Labs  Lab 12/11/21 1133 12/12/21 0623  WBC 6.9 7.9  HGB 16.9 15.5  HCT 47.6 42.9  MCV 88.8 89.0  PLT 357 301   Cardiac Enzymes: No results for input(s): CKTOTAL, CKMB, CKMBINDEX, TROPONINI  in the last 168 hours. BNP: Invalid input(s): POCBNP CBG: No results for input(s): GLUCAP in the last 168 hours. D-Dimer No results for input(s): DDIMER in the last 72 hours. Hgb A1c No results for input(s): HGBA1C in the last 72 hours. Lipid Profile No results for input(s): CHOL, HDL, LDLCALC, TRIG, CHOLHDL, LDLDIRECT in the last 72 hours. Thyroid function studies Recent Labs    12/11/21 1133  TSH 1.913   Anemia work up No results for input(s): VITAMINB12, FOLATE, FERRITIN, TIBC, IRON, RETICCTPCT in the last 72 hours. Urinalysis No results found for: COLORURINE, APPEARANCEUR, LABSPEC, PHURINE, GLUCOSEU, HGBUR, BILIRUBINUR, KETONESUR, PROTEINUR, UROBILINOGEN, NITRITE, LEUKOCYTESUR Sepsis Labs Invalid input(s): PROCALCITONIN,  WBC,  LACTICIDVEN Microbiology Recent Results (from the past 240 hour(s))  Resp Panel by RT-PCR (Flu A&B, Covid) Nasopharyngeal Swab     Status: None   Collection Time: 12/11/21  2:48 PM   Specimen: Nasopharyngeal Swab; Nasopharyngeal(NP) swabs in vial transport medium  Result Value Ref Range Status   SARS Coronavirus 2 by RT PCR NEGATIVE NEGATIVE Final    Comment: (NOTE) SARS-CoV-2 target nucleic acids are NOT DETECTED.  The SARS-CoV-2 RNA is generally detectable in upper respiratory specimens during the acute phase of infection. The lowest concentration of SARS-CoV-2 viral copies this assay can detect is 138 copies/mL. A negative result does not preclude SARS-Cov-2 infection and should not be used as the sole basis for treatment or other patient management decisions. A negative result may occur with  improper specimen collection/handling, submission of specimen other than nasopharyngeal swab, presence of viral mutation(s) within the areas targeted by this assay, and inadequate number of viral copies(<138 copies/mL). A negative result must be combined with clinical observations, patient history, and epidemiological information. The expected result is  Negative.  Fact Sheet for Patients:  BloggerCourse.com  Fact Sheet for Healthcare Providers:  SeriousBroker.it  This test is no t yet approved or cleared by the [place] FDA and  has been authorized for detection and/or diagnosis of SARS-CoV-2 by FDA under an Emergency Use Authorization (EUA). This EUA will remain  in effect (meaning this test can be used) for the duration of the COVID-19 declaration under Section 564(b)(1) of the Act, 21 U.S.C.section 360bbb-3(b)(1), unless the authorization is terminated  or revoked sooner.       Influenza A by PCR NEGATIVE NEGATIVE Final   Influenza B by PCR NEGATIVE NEGATIVE Final    Comment: (NOTE) The Xpert Xpress SARS-CoV-2/FLU/RSV plus assay is intended as an aid in the diagnosis of influenza from Nasopharyngeal swab specimens and should not be used as a sole basis for treatment. Nasal washings and aspirates are unacceptable for Xpert Xpress SARS-CoV-2/FLU/RSV testing.  Fact Sheet for Patients: BloggerCourse.com  Fact Sheet for Healthcare Providers: SeriousBroker.it  This test is not yet approved or cleared by the [place] FDA and has  been authorized for detection and/or diagnosis of SARS-CoV-2 by FDA under an Emergency Use Authorization (EUA). This EUA will remain in effect (meaning this test can be used) for the duration of the COVID-19 declaration under Section 564(b)(1) of the Act, 21 U.S.C. section 360bbb-3(b)(1), unless the authorization is terminated or revoked.  Performed at [facility], [address] [place], [place] [address]      Time coordinating discharge: Over 30 minutes  SIGNED:   Lynn Ito, MD  [facility] 12/12/2021, 11:39 AM Pager   If 7PM-7AM, please contact night-coverage [email or web address] Password [email or web address]

## 2021-12-12 NOTE — ED Notes (Signed)
Pharmacy messaged to send missing meds

## 2021-12-12 NOTE — ED Notes (Signed)
Pt provided coffee and creamer as requested

## 2021-12-12 NOTE — ED Notes (Signed)
Pt left AMA He stated that if we could we could send his RX to [facility] in [place]. Awaiting call back from PMD and Cardiology.

## 2021-12-12 NOTE — ED Notes (Signed)
RN to bedside to put bp cuff back on. Pt asking when doctor will be by because he would like to leave. Will message MD

## 2021-12-12 NOTE — ED Notes (Signed)
Hospitalist called back and said they can not write pt a RX. Pt did not want to stay any longer and will follow up with [facility] on Tuesday

## 2021-12-12 NOTE — ED Notes (Signed)
Pt denies chest pain or palpitations. Pt ambulatory and walked to in room toilet unassisted.

## 2021-12-14 LAB — HEMOGLOBIN A1C
Hgb A1c MFr Bld: 5.6 % (ref 4.8–5.6)
Mean Plasma Glucose: 114 mg/dL

## 2021-12-21 ENCOUNTER — Ambulatory Visit: Payer: Medicare Other | Admitting: Anesthesiology

## 2021-12-21 ENCOUNTER — Encounter: Payer: Self-pay | Admitting: Cardiology

## 2021-12-21 ENCOUNTER — Encounter
Admission: RE | Disposition: A | Discharge status: Home / Self Care | Payer: Self-pay | Source: Ambulatory Visit | Attending: Cardiology

## 2021-12-21 ENCOUNTER — Ambulatory Visit
Admission: RE | Admit: 2021-12-21 | Discharge: 2021-12-21 | Disposition: A | Discharge status: Home / Self Care | Payer: Medicare Other | Source: Ambulatory Visit | Attending: Cardiology | Admitting: Cardiology

## 2021-12-21 ENCOUNTER — Other Ambulatory Visit: Payer: Self-pay

## 2021-12-21 DIAGNOSIS — I4891 Unspecified atrial fibrillation: Secondary | ICD-10-CM | POA: Insufficient documentation

## 2021-12-21 HISTORY — PX: CARDIOVERSION: SHX1299

## 2021-12-21 SURGERY — CARDIOVERSION
Anesthesia: General

## 2021-12-21 MED ORDER — PROPOFOL 10 MG/ML IV BOLUS
INTRAVENOUS | Status: DC | PRN
  Administered 2021-12-21: 50 mg via INTRAVENOUS

## 2021-12-21 MED ORDER — FENTANYL CITRATE (PF) 100 MCG/2ML IJ SOLN: 25.0000 ug | INTRAMUSCULAR | Status: DC | PRN

## 2021-12-21 MED ORDER — ONDANSETRON HCL 4 MG/2ML IJ SOLN: 4.0000 mg | Freq: Once | INTRAMUSCULAR | Status: DC | PRN

## 2021-12-21 MED ORDER — SODIUM CHLORIDE 0.9 % IV SOLN: INTRAVENOUS | Status: DC

## 2021-12-21 NOTE — Transfer of Care (Signed)
Immediate Anesthesia Transfer of Care Note  Patient: William Mcintosh  Procedure(s) Performed: CARDIOVERSION  Patient Location: PACU and special recoveries  Anesthesia Type:General  Level of Consciousness: drowsy and patient cooperative  Airway & Oxygen Therapy: Patient Spontanous Breathing and Patient connected to nasal cannula oxygen  Post-op Assessment: Report given to RN and Post -op Vital signs reviewed and stable  Post vital signs: Reviewed and stable  Last Vitals:  Vitals Value Taken Time  BP 114/84 12/21/21 0755  Temp    Pulse 70 12/21/21 0757  Resp 17 12/21/21 0757  SpO2 97 % 12/21/21 0757    Last Pain:  Vitals:   12/21/21 0734  TempSrc: Oral  PainSc:          Complications: No notable events documented.

## 2021-12-21 NOTE — Anesthesia Preprocedure Evaluation (Signed)
Anesthesia Evaluation  Patient identified by MRN, date of birth, ID band Patient awake    Reviewed: Allergy & Precautions, H&P , NPO status , Patient's Chart, lab work & pertinent test results, reviewed documented beta blocker date and time   Airway Mallampati: II   Neck ROM: full    Dental  (+) Teeth Intact   Pulmonary neg pulmonary ROS,    Pulmonary exam normal        Cardiovascular Exercise Tolerance: Good hypertension, On Medications negative cardio ROS  Atrial Fibrillation  Rhythm:regular Rate:Normal     Neuro/Psych negative neurological ROS  negative psych ROS   GI/Hepatic negative GI ROS, Neg liver ROS,   Endo/Other  negative endocrine ROS  Renal/GU negative Renal ROS  negative genitourinary   Musculoskeletal   Abdominal   Peds  Hematology negative hematology ROS (+)   Anesthesia Other Findings Past Medical History: No date: Atrial flutter (HCC) No date: Hyperlipidemia No date: Hypertension Past Surgical History: No date: CARDIAC ELECTROPHYSIOLOGY MAPPING AND ABLATION No date: CHOLECYSTECTOMY BMI    Body Mass Index: 31.01 kg/m     Reproductive/Obstetrics negative OB ROS                             Anesthesia Physical Anesthesia Plan  ASA: 4  Anesthesia Plan: General   Post-op Pain Management:    Induction:   PONV Risk Score and Plan:   Airway Management Planned:   Additional Equipment:   Intra-op Plan:   Post-operative Plan:   Informed Consent: I have reviewed the patients History and Physical, chart, labs and discussed the procedure including the risks, benefits and alternatives for the proposed anesthesia with the patient or authorized representative who has indicated his/her understanding and acceptance.     Dental Advisory Given  Plan Discussed with: CRNA  Anesthesia Plan Comments:         Anesthesia Quick Evaluation

## 2021-12-21 NOTE — Op Note (Signed)
[facility]   12/21/2021                     8:02 AM  PATIENT:  William Mcintosh    PRE-OPERATIVE DIAGNOSIS:  Cardioversion   AFib   2nd case okd by Kenard Gower  POST-OPERATIVE DIAGNOSIS:  Same  PROCEDURE:  CARDIOVERSION  SURGEON:  Marcina Millard, MD    ANESTHESIA:     PREOPERATIVE INDICATIONS:  William Mcintosh is a  66 y.o. male with a diagnosis of Cardioversion   AFib   2nd case okd by Kenard Gower who failed conservative measures and elected for surgical management.    The risks benefits and alternatives were discussed with the patient preoperatively including but not limited to the risks of infection, bleeding, cardiopulmonary complications, the need for revision surgery, among others, and the patient was willing to proceed.   OPERATIVE PROCEDURE: The patient was brought to the special procedures holding area in a fasting state.  ECG revealed atrial flutter with rapid ventricular rate.  The patient received 50 mg of propofol and the patient underwent electrical cardioversion, successfully converted to sinus rhythm with 75 J.  There were no periprocedural complications.

## 2021-12-22 NOTE — Anesthesia Postprocedure Evaluation (Signed)
Anesthesia Post Note  Patient: William Mcintosh  Procedure(s) Performed: CARDIOVERSION  Patient location during evaluation: PACU Anesthesia Type: General Level of consciousness: awake and alert Pain management: pain level controlled Vital Signs Assessment: post-procedure vital signs reviewed and stable Respiratory status: spontaneous breathing, nonlabored ventilation, respiratory function stable and patient connected to nasal cannula oxygen Cardiovascular status: blood pressure returned to baseline and stable Postop Assessment: no apparent nausea or vomiting Anesthetic complications: no   No notable events documented.   Last Vitals:  Vitals:   12/21/21 0800 12/21/21 0815  BP: 110/83 123/89  Pulse: 73 73  Resp: 17 16  Temp:    SpO2: 99% 99%    Last Pain:  Vitals:   12/21/21 0734  TempSrc: Oral  PainSc:                  Molli Barrows

## 2022-01-12 ENCOUNTER — Institutional Professional Consult (permissible substitution): Payer: Medicare Other | Admitting: Cardiology

## 2022-01-25 ENCOUNTER — Encounter
Admission: RE | Disposition: A | Discharge status: Home / Self Care | Payer: Self-pay | Source: Ambulatory Visit | Attending: Cardiology

## 2022-01-25 ENCOUNTER — Ambulatory Visit
Admission: RE | Admit: 2022-01-25 | Discharge: 2022-01-25 | Disposition: A | Discharge status: Home / Self Care | Payer: Medicare Other | Source: Ambulatory Visit | Attending: Cardiology | Admitting: Cardiology

## 2022-01-25 ENCOUNTER — Ambulatory Visit: Payer: Medicare Other | Admitting: Certified Registered"

## 2022-01-25 ENCOUNTER — Other Ambulatory Visit: Payer: Self-pay

## 2022-01-25 ENCOUNTER — Encounter: Payer: Self-pay | Admitting: Cardiology

## 2022-01-25 DIAGNOSIS — I10 Essential (primary) hypertension: Secondary | ICD-10-CM | POA: Diagnosis not present

## 2022-01-25 DIAGNOSIS — G4730 Sleep apnea, unspecified: Secondary | ICD-10-CM | POA: Diagnosis not present

## 2022-01-25 DIAGNOSIS — I4891 Unspecified atrial fibrillation: Secondary | ICD-10-CM | POA: Diagnosis present

## 2022-01-25 HISTORY — DX: Sleep apnea, unspecified: G47.30

## 2022-01-25 HISTORY — PX: CARDIOVERSION: SHX1299

## 2022-01-25 SURGERY — CARDIOVERSION
Anesthesia: General

## 2022-01-25 MED ORDER — PROPOFOL 10 MG/ML IV BOLUS
INTRAVENOUS | Status: DC | PRN
  Administered 2022-01-25: 50 mg via INTRAVENOUS

## 2022-01-25 MED ORDER — SODIUM CHLORIDE 0.9 % IV SOLN: INTRAVENOUS | Status: DC

## 2022-01-25 MED ORDER — PROPOFOL 10 MG/ML IV BOLUS
INTRAVENOUS | Status: AC
  Filled 2022-01-25: qty 40

## 2022-01-25 NOTE — Anesthesia Preprocedure Evaluation (Signed)
Anesthesia Evaluation  Patient identified by MRN, date of birth, ID band Patient awake    Reviewed: Allergy & Precautions, H&P , NPO status , Patient's Chart, lab work & pertinent test results, reviewed documented beta blocker date and time   History of Anesthesia Complications Negative for: history of anesthetic complications  Airway Mallampati: II  TM Distance: <3 FB Neck ROM: full    Dental  (+) Poor Dentition, Chipped, Missing   Pulmonary sleep apnea ,    Pulmonary exam normal        Cardiovascular Exercise Tolerance: Good hypertension, On Medications (-) angina+ dysrhythmias Atrial Fibrillation  Rhythm:irregular     Neuro/Psych negative neurological ROS  negative psych ROS   GI/Hepatic negative GI ROS, Neg liver ROS, neg GERD  ,  Endo/Other  negative endocrine ROS  Renal/GU negative Renal ROS  negative genitourinary   Musculoskeletal   Abdominal   Peds  Hematology negative hematology ROS (+)   Anesthesia Other Findings Past Medical History: No date: Atrial flutter (HCC) No date: Hyperlipidemia No date: Hypertension Past Surgical History: No date: CARDIAC ELECTROPHYSIOLOGY MAPPING AND ABLATION No date: CHOLECYSTECTOMY BMI    Body Mass Index: 31.01 kg/m     Reproductive/Obstetrics negative OB ROS                             Anesthesia Physical  Anesthesia Plan  ASA: 4  Anesthesia Plan: General   Post-op Pain Management:    Induction: Intravenous  PONV Risk Score and Plan:   Airway Management Planned: Natural Airway and Nasal Cannula  Additional Equipment:   Intra-op Plan:   Post-operative Plan:   Informed Consent: I have reviewed the patients History and Physical, chart, labs and discussed the procedure including the risks, benefits and alternatives for the proposed anesthesia with the patient or authorized representative who has indicated his/her understanding  and acceptance.     Dental Advisory Given  Plan Discussed with: CRNA  Anesthesia Plan Comments: (Patient consented for risks of anesthesia including but not limited to:  - adverse reactions to medications - risk of airway placement if required - damage to eyes, teeth, lips or other oral mucosa - nerve damage due to positioning  - sore throat or hoarseness - Damage to heart, brain, nerves, lungs, other parts of body or loss of life  Patient voiced understanding.)        Anesthesia Quick Evaluation

## 2022-01-25 NOTE — Op Note (Signed)
[facility] Cardiology   01/25/2022                     7:42 AM  PATIENT:  [name]    PRE-OPERATIVE DIAGNOSIS:  Cardioversion   AFib  POST-OPERATIVE DIAGNOSIS:  Same  PROCEDURE:  CARDIOVERSION  SURGEON:  Isaias Cowman, MD    ANESTHESIA:     PREOPERATIVE INDICATIONS:  William Mcintosh is a  66 y.o. male with a diagnosis of Cardioversion   AFib who failed conservative measures and elected for surgical management.    The risks benefits and alternatives were discussed with the patient preoperatively including but not limited to the risks of infection, bleeding, cardiopulmonary complications, the need for revision surgery, among others, and the patient was willing to proceed.   OPERATIVE PROCEDURE: The patient was brought to the special holding area in a fasting state.  Preprocedural ECG revealed atrial flutter at a rate of 130 bpm.  The patient received 50 mg of propofol.  Cardioversion was performed with 75 J with successful conversion to sinus rhythm at 75 bpm.  There were no periprocedural complications.

## 2022-01-25 NOTE — Anesthesia Postprocedure Evaluation (Signed)
Anesthesia Post Note  Patient: CULLIN DISHMAN  Procedure(s) Performed: CARDIOVERSION  Patient location during evaluation: Specials Recovery Anesthesia Type: General Level of consciousness: awake and alert Pain management: pain level controlled Vital Signs Assessment: post-procedure vital signs reviewed and stable Respiratory status: spontaneous breathing, nonlabored ventilation, respiratory function stable and patient connected to nasal cannula oxygen Cardiovascular status: blood pressure returned to baseline and stable Postop Assessment: no apparent nausea or vomiting Anesthetic complications: no   No notable events documented.   Last Vitals:  Vitals:   01/25/22 0741 01/25/22 0745  BP:  116/86  Pulse: 69 77  Resp: 13 15  Temp:    SpO2: 98% 96%    Last Pain:  Vitals:   01/25/22 0725  TempSrc: Oral  PainSc: 0-No pain                 Cleda Mccreedy Eustacia Urbanek

## 2022-01-25 NOTE — Transfer of Care (Signed)
Immediate Anesthesia Transfer of Care Note  Patient: William Mcintosh  Procedure(s) Performed: CARDIOVERSION  Patient Location: PACU and Cath Lab  Anesthesia Type:General  Level of Consciousness: drowsy  Airway & Oxygen Therapy: Patient Spontanous Breathing and Patient connected to nasal cannula oxygen  Post-op Assessment: Report given to RN  Post vital signs: stable  Last Vitals:  Vitals Value Taken Time  BP 124/80 01/25/22 0740  Temp    Pulse 69 01/25/22 0741  Resp 13 01/25/22 0741  SpO2 98 % 01/25/22 0741    Last Pain:  Vitals:   01/25/22 0725  TempSrc: Oral  PainSc: 0-No pain         Complications: No notable events documented.

## 2022-08-04 IMAGING — CR DG CHEST 2V
2 series · 2 of 2 positions shown · non-contrast
Comparison: 11/25/2021

CLINICAL DATA: Chest pain

EXAM:
CHEST - 2 VIEW

[chest pa]
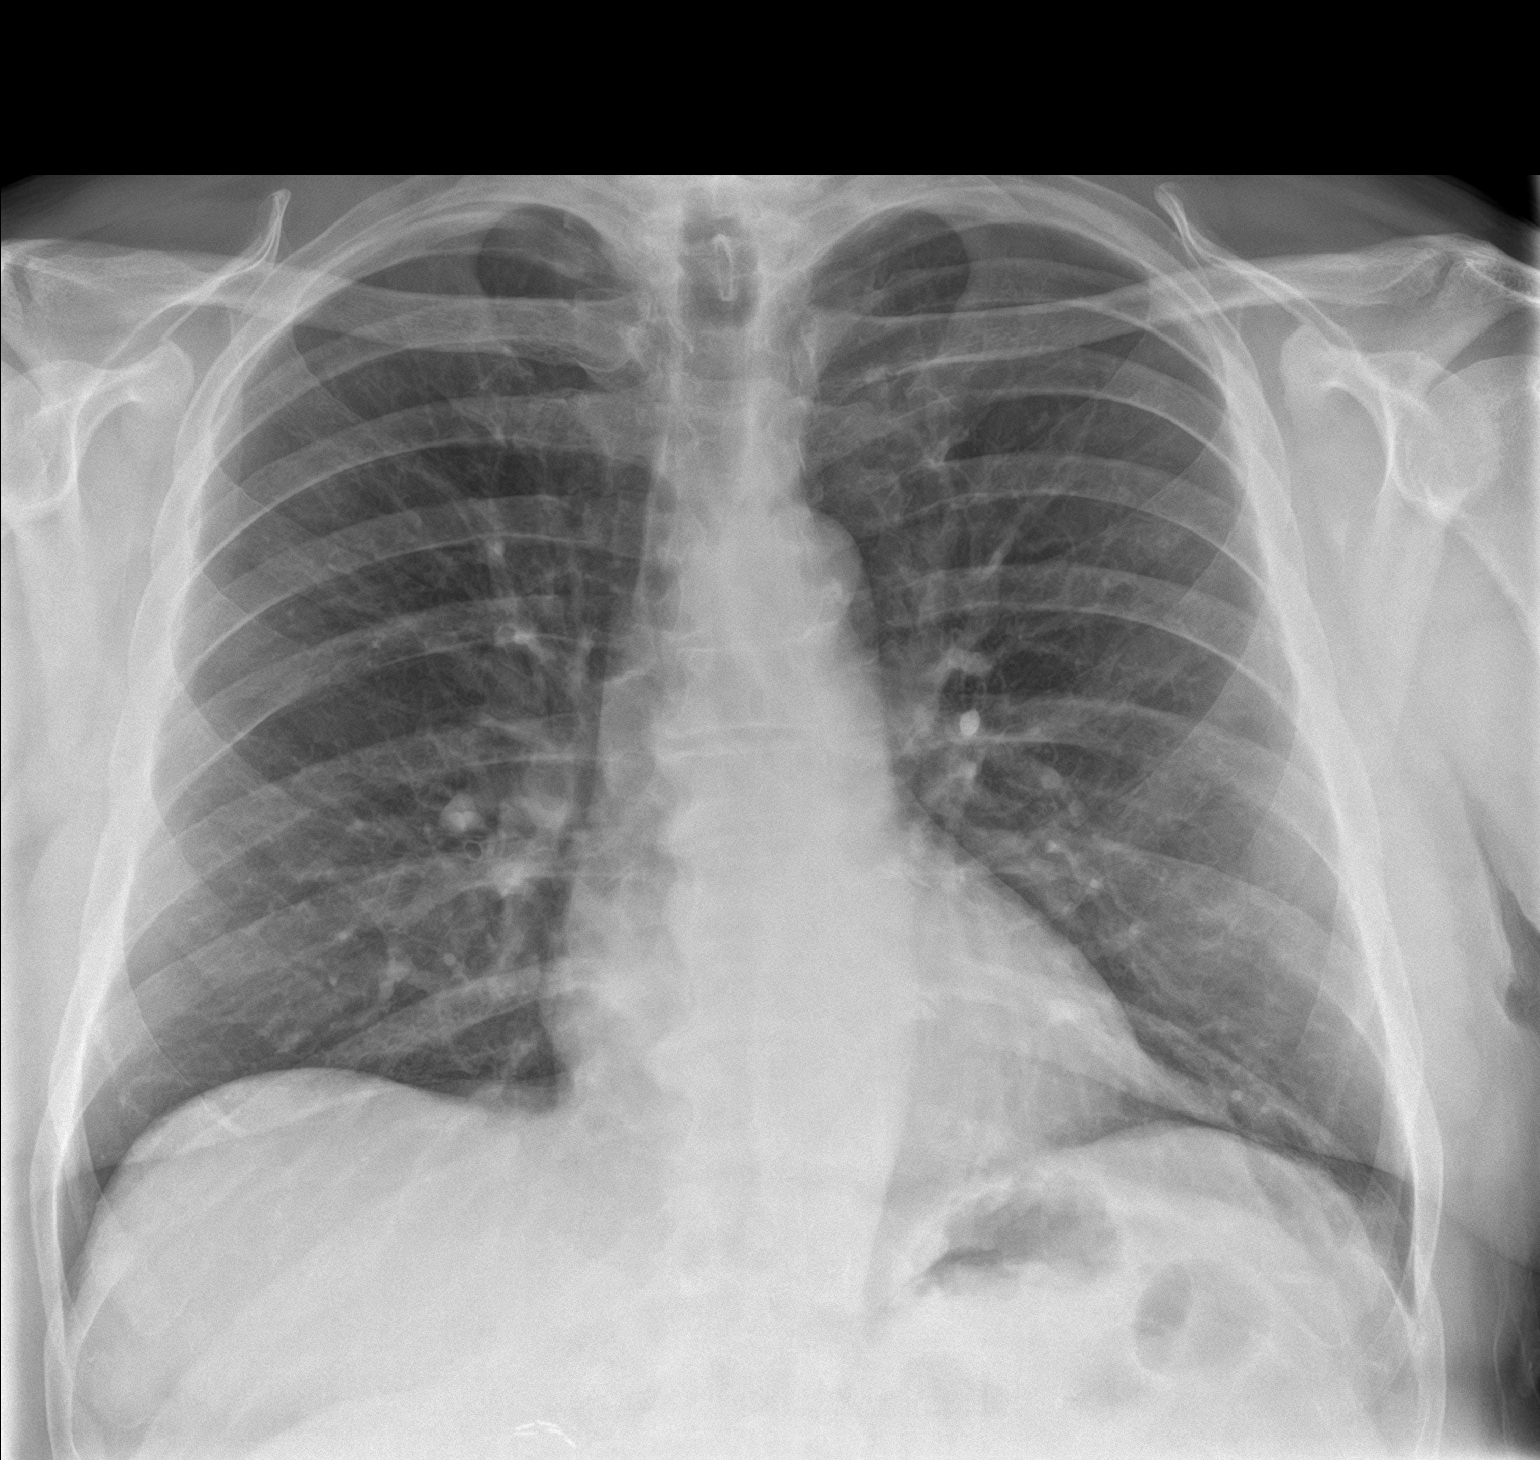

[chest lat]
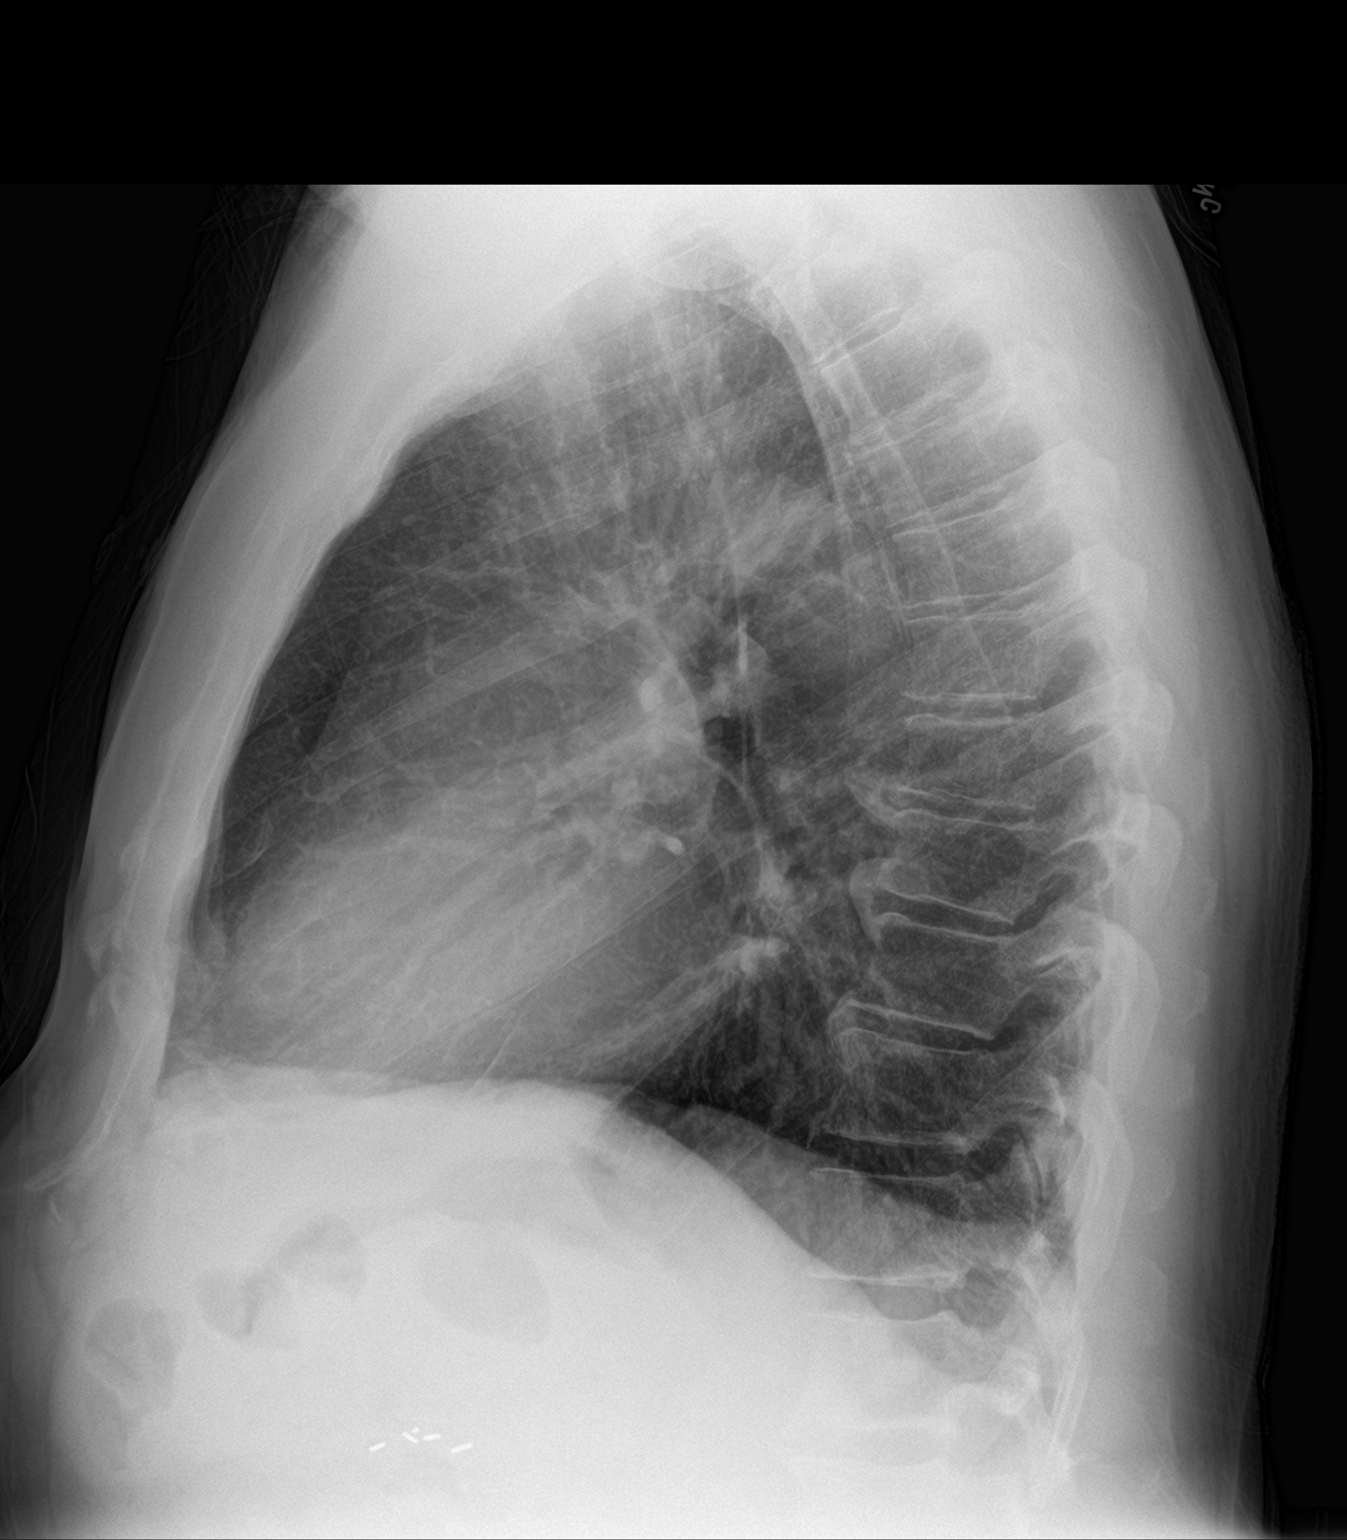

[2 of 2 positions shown; findings below may reference images not displayed]

FINDINGS: The heart size and mediastinal contours are within normal limits.
Both lungs are clear. The visualized skeletal structures are
unremarkable. Surgical clips are seen in the right upper quadrant.
IMPRESSION: No active cardiopulmonary disease.

## 2024-11-28 ENCOUNTER — Other Ambulatory Visit: Payer: Self-pay | Admitting: Physician Assistant

## 2024-11-28 DIAGNOSIS — R9431 Abnormal electrocardiogram [ECG] [EKG]: Secondary | ICD-10-CM

## 2024-11-28 DIAGNOSIS — E7800 Pure hypercholesterolemia, unspecified: Secondary | ICD-10-CM

## 2024-11-28 DIAGNOSIS — Z8249 Family history of ischemic heart disease and other diseases of the circulatory system: Secondary | ICD-10-CM

## 2024-12-31 ENCOUNTER — Ambulatory Visit
Admission: RE | Admit: 2024-12-31 | Discharge: 2024-12-31 | Disposition: A | Discharge status: Home / Self Care | Payer: Self-pay | Source: Ambulatory Visit | Attending: Physician Assistant | Admitting: Physician Assistant

## 2024-12-31 DIAGNOSIS — R9431 Abnormal electrocardiogram [ECG] [EKG]: Secondary | ICD-10-CM | POA: Insufficient documentation

## 2024-12-31 DIAGNOSIS — Z8249 Family history of ischemic heart disease and other diseases of the circulatory system: Secondary | ICD-10-CM | POA: Insufficient documentation

## 2024-12-31 DIAGNOSIS — E7800 Pure hypercholesterolemia, unspecified: Secondary | ICD-10-CM | POA: Insufficient documentation

## 2025-01-03 ENCOUNTER — Other Ambulatory Visit: Payer: Self-pay | Admitting: Physician Assistant

## 2025-01-03 DIAGNOSIS — R931 Abnormal findings on diagnostic imaging of heart and coronary circulation: Secondary | ICD-10-CM

## 2025-01-03 DIAGNOSIS — Z8249 Family history of ischemic heart disease and other diseases of the circulatory system: Secondary | ICD-10-CM

## 2025-01-03 DIAGNOSIS — I2584 Coronary atherosclerosis due to calcified coronary lesion: Secondary | ICD-10-CM

## 2025-01-03 DIAGNOSIS — E785 Hyperlipidemia, unspecified: Secondary | ICD-10-CM

## 2025-01-10 ENCOUNTER — Telehealth (HOSPITAL_COMMUNITY): Payer: Self-pay | Admitting: *Deleted

## 2025-01-10 NOTE — Telephone Encounter (Signed)
 Reaching out to patient to offer assistance regarding upcoming cardiac imaging study; pt verbalizes understanding of appt date/time, parking situation and where to check in, pre-test NPO status and medications ordered, and verified current allergies; name and call back number provided for further questions should they arise Sid Seats RN Navigator Cardiac Imaging [facility] [phone] office [phone] cell

## 2025-01-13 ENCOUNTER — Other Ambulatory Visit: Payer: Self-pay | Admitting: Cardiovascular Disease

## 2025-01-13 ENCOUNTER — Ambulatory Visit
Admission: RE | Admit: 2025-01-13 | Discharge: 2025-01-13 | Disposition: A | Discharge status: Home / Self Care | Source: Ambulatory Visit | Attending: Physician Assistant | Admitting: Physician Assistant

## 2025-01-13 DIAGNOSIS — R931 Abnormal findings on diagnostic imaging of heart and coronary circulation: Secondary | ICD-10-CM | POA: Diagnosis present

## 2025-01-13 DIAGNOSIS — I2510 Atherosclerotic heart disease of native coronary artery without angina pectoris: Secondary | ICD-10-CM

## 2025-01-13 DIAGNOSIS — I2584 Coronary atherosclerosis due to calcified coronary lesion: Secondary | ICD-10-CM | POA: Diagnosis present

## 2025-01-13 DIAGNOSIS — Z8249 Family history of ischemic heart disease and other diseases of the circulatory system: Secondary | ICD-10-CM | POA: Diagnosis present

## 2025-01-13 DIAGNOSIS — E785 Hyperlipidemia, unspecified: Secondary | ICD-10-CM | POA: Diagnosis present

## 2025-01-13 MED ORDER — IOHEXOL 350 MG/ML SOLN
100.0000 mL | Freq: Once | INTRAVENOUS | Status: AC | PRN
  Administered 2025-01-13: 100 mL via INTRAVENOUS

## 2025-01-13 MED ORDER — NITROGLYCERIN 0.4 MG SL SUBL
0.8000 mg | SUBLINGUAL_TABLET | Freq: Once | SUBLINGUAL | Status: AC
  Administered 2025-01-13: 0.8 mg via SUBLINGUAL

## 2025-01-13 NOTE — Progress Notes (Signed)
 Patient tolerated procedure well. Ambulate w/o difficulty. Denies any lightheadedness or being dizzy. Pt denies any pain at this time. Sitting in chair. Pt is encouraged to drink additional water throughout the day and reason explained to patient. Patient verbalized understanding and all questions answered. ABC intact. No further needs at this time. Discharge from procedure area w/o issues.

## 2025-02-06 ENCOUNTER — Ambulatory Visit: Admit: 2025-02-06 | Admitting: Cardiology

## 2025-02-06 DIAGNOSIS — R9430 Abnormal result of cardiovascular function study, unspecified: Secondary | ICD-10-CM
